# Patient Record
Sex: Male | Born: 1975 | Race: White | Hispanic: No | Marital: Married | Smoking: Never smoker
Health system: Southern US, Community
[De-identification: ages and names within clinical notes are randomized; demographics above are authoritative.]

## PROBLEM LIST (undated history)

## (undated) DIAGNOSIS — K56609 Unspecified intestinal obstruction, unspecified as to partial versus complete obstruction: Secondary | ICD-10-CM

## (undated) DIAGNOSIS — K5090 Crohn's disease, unspecified, without complications: Secondary | ICD-10-CM

## (undated) HISTORY — PX: KNEE SURGERY: SHX244

## (undated) HISTORY — PX: ABDOMINAL SURGERY: SHX537

---

## 2011-10-21 ENCOUNTER — Emergency Department (HOSPITAL_BASED_OUTPATIENT_CLINIC_OR_DEPARTMENT_OTHER)
Admission: EM | Admit: 2011-10-21 | Discharge: 2011-10-22 | Disposition: A | Discharge status: Home / Self Care | Payer: Managed Care, Other (non HMO) | Attending: Emergency Medicine | Admitting: Emergency Medicine

## 2011-10-21 ENCOUNTER — Encounter (HOSPITAL_BASED_OUTPATIENT_CLINIC_OR_DEPARTMENT_OTHER): Payer: Self-pay | Admitting: *Deleted

## 2011-10-21 DIAGNOSIS — R21 Rash and other nonspecific skin eruption: Secondary | ICD-10-CM | POA: Insufficient documentation

## 2011-10-21 DIAGNOSIS — L509 Urticaria, unspecified: Secondary | ICD-10-CM | POA: Insufficient documentation

## 2011-10-21 DIAGNOSIS — K5090 Crohn's disease, unspecified, without complications: Secondary | ICD-10-CM | POA: Insufficient documentation

## 2011-10-21 HISTORY — DX: Crohn's disease, unspecified, without complications: K50.90

## 2011-10-21 MED ORDER — FAMOTIDINE 20 MG PO TABS: 20.0000 mg | ORAL_TABLET | Freq: Two times a day (BID) | ORAL | Status: DC

## 2011-10-21 MED ORDER — FAMOTIDINE 20 MG PO TABS
20.0000 mg | ORAL_TABLET | Freq: Once | ORAL | Status: AC
  Administered 2011-10-22: 20 mg via ORAL
  Filled 2011-10-21: qty 1

## 2011-10-21 NOTE — ED Provider Notes (Signed)
History     CSN: [id number]  Arrival date & time 10/21/11  2037   First MD Initiated Contact with Patient 10/21/11 2311      Chief Complaint  Patient presents with  . Rash    (Consider location/radiation/quality/duration/timing/severity/associated sxs/prior treatment) Patient is a 36 y.o. male presenting with rash. The history is provided by the patient.  Rash  The current episode started 12 to 24 hours ago. The problem has been rapidly improving. The problem is associated with new medications. There has been no fever. The rash is present on the trunk, right arm and left arm. The pain is at a severity of 0/10. The patient is experiencing no pain. Associated symptoms include itching. Pertinent negatives include no blisters, no pain and no weeping. He has tried antihistamines for the symptoms. The treatment provided significant relief.   Patient with history of Crohn's disease being treated [facility] with Remicade injections has been having trouble with urticaria on and off since then in conversation with his GI doctor at [facility] they're going to stop this but it is an injection and stays around for several weeks patient with Benadryl this evening at 7:30 with now significant improvement in the hives never had any breathing problems lip swelling or tongue swelling.   Past Medical History  Diagnosis Date  . Crohn disease     Past Surgical History  Procedure Date  . Abdominal surgery     History reviewed. No pertinent family history.  History  Substance Use Topics  . Smoking status: Never Smoker   . Smokeless tobacco: Not on file  . Alcohol Use: No      Review of Systems  Constitutional: Negative for fever and chills.  HENT: Negative for facial swelling, trouble swallowing and neck pain.   Eyes: Negative for itching.  Respiratory: Negative for shortness of breath.   Cardiovascular: Negative for chest pain.  Gastrointestinal: Negative for abdominal pain.  Genitourinary: Negative  for dysuria.  Musculoskeletal: Negative for back pain.  Skin: Positive for itching and rash.  Neurological: Negative for syncope, speech difficulty and headaches.  Hematological: Does not bruise/bleed easily.    Allergies  Demerol; Morphine and related; and Remicade  Home Medications   Current Outpatient Rx  Name Route Sig Dispense Refill  . DIPHENHYDRAMINE HCL 25 MG PO TABS Oral Take 50 mg by mouth every 6 (six) hours as needed. For allergic reaction    . DIPHENOXYLATE-ATROPINE 2.5-0.025 MG PO TABS Oral Take 2 tablets by mouth 3 (three) times daily.    Marland Kitchen FAMOTIDINE 20 MG PO TABS Oral Take 1 tablet (20 mg total) by mouth 2 (two) times daily. 30 tablet 0    BP 138/77  Pulse 74  Temp(Src) 97.5 F (36.4 C) (Oral)  Resp 16  Ht 6' (1.829 m)  Wt 186 lb (84.369 kg)  BMI 25.23 kg/m2  SpO2 100%  Physical Exam  Nursing note and vitals reviewed. Constitutional: He is oriented to person, place, and time. He appears well-developed and well-nourished. No distress.  HENT:  Head: Normocephalic and atraumatic.  Mouth/Throat: Oropharynx is clear and moist.       No lip or tongue swelling  Eyes: Conjunctivae and EOM are normal. Pupils are equal, round, and reactive to light.  Neck: Normal range of motion. Neck supple.  Cardiovascular: Normal rate, regular rhythm and normal heart sounds.   No murmur heard. Pulmonary/Chest: Effort normal and breath sounds normal. No respiratory distress. He has no wheezes.  Abdominal: Soft. Bowel sounds are  normal. There is no tenderness.  Musculoskeletal: Normal range of motion. He exhibits no edema.  Neurological: He is alert and oriented to person, place, and time. No cranial nerve deficit. He exhibits normal muscle tone. Coordination normal.  Skin: Skin is warm. Rash noted. There is erythema.       Red blotches remnant of resolving urticaria no distinct hives currently her blotches are predominantly on the trunk and upper extremity.    ED Course    Procedures (including critical care time)  Labs Reviewed - No data to display No results found.   1. Hives       MDM   Patient with a history of Crohn's disease followed by physicians at [facility] has been getting injections of Remicade, has been having trouble with urticaria hives on and off since then. His GI doctor at [facility] is going to stop these these are injections of the around for several weeks yet. In the ED without any specific treatment on our part the hives have resolved significantly patient did have Benadryl at 7:30 this evening. No trouble breathing no lip or tongue swelling no wheezing. Patient due to his Crohn's refer not to have a course of steroids will supplement with Pepcid recommended taking Benadryl for the next 24 hours and Pepcid for the next 7 days.        Shelda Jakes, MD 10/22/11 0005

## 2011-10-21 NOTE — ED Notes (Signed)
Pt c/o rash to chest abd, and arms x 2 days from remicade

## 2016-05-31 ENCOUNTER — Encounter (INDEPENDENT_AMBULATORY_CARE_PROVIDER_SITE_OTHER): Payer: Self-pay

## 2016-05-31 ENCOUNTER — Ambulatory Visit (HOSPITAL_BASED_OUTPATIENT_CLINIC_OR_DEPARTMENT_OTHER)
Admission: RE | Admit: 2016-05-31 | Discharge: 2016-05-31 | Disposition: A | Discharge status: Home / Self Care | Payer: Managed Care, Other (non HMO) | Source: Ambulatory Visit | Attending: Family Medicine | Admitting: Family Medicine

## 2016-05-31 ENCOUNTER — Ambulatory Visit (INDEPENDENT_AMBULATORY_CARE_PROVIDER_SITE_OTHER): Payer: Managed Care, Other (non HMO) | Admitting: Family Medicine

## 2016-05-31 ENCOUNTER — Encounter: Payer: Self-pay | Admitting: Family Medicine

## 2016-05-31 VITALS — BP 121/78 | HR 65 | Ht 72.0 in | Wt 181.0 lb

## 2016-05-31 DIAGNOSIS — S59901A Unspecified injury of right elbow, initial encounter: Secondary | ICD-10-CM

## 2016-05-31 DIAGNOSIS — X58XXXA Exposure to other specified factors, initial encounter: Secondary | ICD-10-CM | POA: Diagnosis not present

## 2016-05-31 NOTE — Patient Instructions (Signed)
I'm concerned you tore the UCL in your elbow and have a partial common flexor tendon tear. Get x-rays downstairs as you leave today.   Assuming these are normal I would go ahead with an MRI. In meantime ice the area 15 minutes at a time 3-4 times a day. Ibuprofen 600mg  three times a day with food OR aleve 2 tabs twice a day with food for pain and inflammation. Compression (ACE wrap or sleeve) to help with the swelling. Follow up vs referral will depend on the imaging results.

## 2016-06-01 DIAGNOSIS — S59901A Unspecified injury of right elbow, initial encounter: Secondary | ICD-10-CM | POA: Insufficient documentation

## 2016-06-01 NOTE — Assessment & Plan Note (Signed)
concerning for UCL tear, partial flexor tendon tear (lateral noted on ultrasound).  Independently reviewed radiographs and no evidence fracture.  Will go ahead with MRI to assess.  Icing, nsaids, compression in meantime.

## 2016-06-01 NOTE — Progress Notes (Addendum)
PCP: Olivia MackieWilliam Cho, MD  Subjective:   HPI: Patient is a 40 y.o. male here for right elbow injury.  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 Was about 1 2/3rds inning in when during a fastball he felt and heart a crack and pop medial right elbow. He throws right handed. Pain has improved since then but still with swelling, 2/10 level of pain and soreness. Cannot straighten out elbow. No prior injuries. Pain is medial. No numbness or tingling. No skin changes.  Past Medical History:  Diagnosis Date  . Crohn disease [facility](HCC)     Current Outpatient Prescriptions on File Prior to Visit  Medication Sig Dispense Refill  . diphenhydrAMINE (BENADRYL) 25 MG tablet Take 50 mg by mouth every 6 (six) hours as needed. For allergic reaction    . diphenoxylate-atropine (LOMOTIL) 2.5-0.025 MG per tablet Take 2 tablets by mouth 3 (three) times daily.    . famotidine (PEPCID) 20 MG tablet Take 1 tablet (20 mg total) by mouth 2 (two) times daily. 30 tablet 0   No current facility-administered medications on file prior to visit.     Past Surgical History:  Procedure Laterality Date  . ABDOMINAL SURGERY      Allergies  Allergen Reactions  . Demerol [Meperidine Hcl] Nausea And Vomiting and Rash  . Morphine And Related Nausea And Vomiting and Rash  . Remicade [Infliximab] Rash    Social History   Social History  . Marital status: Married    Spouse name: N/A  . Number of children: N/A  . Years of education: N/A   Occupational History  . Not on file.   Social History Main Topics  . Smoking status: Never Smoker  . Smokeless tobacco: Never Used  . Alcohol use No  . Drug use: No  . Sexual activity: No   Other Topics Concern  . Not on file   Social History Narrative  . No narrative on file    No family history on file.  BP 121/78   Pulse 65   Ht 6' (1.829 m)   Wt 181 lb (82.1 kg)   BMI 24.55 kg/m   Review of Systems: See HPI above.     Objective:  Physical Exam:  Gen: NAD, comfortable in exam room  Right elbow: Mild swelling medially and over antecubital area.  No bruising.  No other deformity. TTP medial epicondyle and just distal to this, over UCL. Full flexion but lacks 10 degrees extension. Pain with resisted wrist, digit flexion, pronation. Laxity on valgus stress compared to left elbow.  No laxity varus. NVI distally. Negative tinels cubital tunnel.  Left elbow: FROM without pain.  MSK u/s:  UCL difficult to visualize.  Common flexor tendon appears partially torn at insertion on medial epicondyle with edema.  No bony abnormalities.    Assessment & Plan:  1. Right elbow injury - concerning for UCL tear, partial flexor tendon tear (noted on ultrasound).  Independently reviewed radiographs and no evidence fracture.  Will go ahead with MRI to assess.  Icing, nsaids, compression in meantime.  Addendum:  MRI reviewed and discussed with patient.  He has torn the UCL as expected.  The common flexor tendon appears better than it does on ultrasound.  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  He will start with conservative treatment - 2 weeks of rest, icing,  nsaids then will f/u in office.  Plan to start physical therapy at that time.

## 2016-06-04 ENCOUNTER — Ambulatory Visit (HOSPITAL_BASED_OUTPATIENT_CLINIC_OR_DEPARTMENT_OTHER)
Admission: RE | Admit: 2016-06-04 | Discharge: 2016-06-04 | Disposition: A | Discharge status: Home / Self Care | Payer: Managed Care, Other (non HMO) | Source: Ambulatory Visit | Attending: Family Medicine | Admitting: Family Medicine

## 2016-06-04 DIAGNOSIS — M6289 Other specified disorders of muscle: Secondary | ICD-10-CM | POA: Diagnosis not present

## 2016-06-04 DIAGNOSIS — M25422 Effusion, left elbow: Secondary | ICD-10-CM | POA: Diagnosis not present

## 2016-06-04 DIAGNOSIS — S59901A Unspecified injury of right elbow, initial encounter: Secondary | ICD-10-CM

## 2016-06-04 DIAGNOSIS — S53441A Ulnar collateral ligament sprain of right elbow, initial encounter: Secondary | ICD-10-CM | POA: Insufficient documentation

## 2016-06-07 ENCOUNTER — Encounter: Payer: Self-pay | Admitting: Family Medicine

## 2016-06-20 ENCOUNTER — Ambulatory Visit (INDEPENDENT_AMBULATORY_CARE_PROVIDER_SITE_OTHER): Payer: Managed Care, Other (non HMO) | Admitting: Family Medicine

## 2016-06-20 ENCOUNTER — Encounter: Payer: Self-pay | Admitting: Family Medicine

## 2016-06-20 VITALS — BP 93/61 | HR 94 | Ht 72.0 in | Wt 181.0 lb

## 2016-06-20 DIAGNOSIS — S59901A Unspecified injury of right elbow, initial encounter: Secondary | ICD-10-CM | POA: Diagnosis not present

## 2016-06-20 NOTE — Patient Instructions (Signed)
Start physical therapy - do home exercises on days you don't go to therapy. Icing, ibuprofen/aleve only if needed now. Follow up with me about a month after you've started therapy.

## 2016-06-21 NOTE — Progress Notes (Signed)
PCP: Olivia MackieWilliam Cho, MD  Subjective:   HPI: Patient is a 40 y.o. male here for right elbow injury.  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 Was about 1 2/3rds inning in when during a fastball he felt and heart a crack and pop medial right elbow. He throws right handed. Pain has improved since then but still with swelling, 2/10 level of pain and soreness. Cannot straighten out elbow. No prior injuries. Pain is medial. No numbness or tingling. No skin changes.  9/11: Patient reports he has improved since last visit. Pain level 0/10. Still does not feel like he could throw a baseball - gets sharp twinge at times medially that goes away quickly. Pain up to 6/10 only with this twinge. No swelling. Done with sling and medications. No numbness or tingling. No skin changes.  Past Medical History:  Diagnosis Date  . Crohn disease [facility](HCC)     Current Outpatient Prescriptions on File Prior to Visit  Medication Sig Dispense Refill  . diphenhydrAMINE (BENADRYL) 25 MG tablet Take 50 mg by mouth every 6 (six) hours as needed. For allergic reaction    . diphenoxylate-atropine (LOMOTIL) 2.5-0.025 MG per tablet Take 2 tablets by mouth 3 (three) times daily.    . famotidine (PEPCID) 20 MG tablet Take 1 tablet (20 mg total) by mouth 2 (two) times daily. 30 tablet 0   No current facility-administered medications on file prior to visit.     Past Surgical History:  Procedure Laterality Date  . ABDOMINAL SURGERY      Allergies  Allergen Reactions  . Demerol [Meperidine Hcl] Nausea And Vomiting and Rash  . Morphine And Related Nausea And Vomiting and Rash  . Remicade [Infliximab] Rash    Social History   Social History  . Marital status: Married    Spouse name: N/A  . Number of children: N/A  . Years of education: N/A   Occupational History  . Not on file.   Social History Main Topics  . Smoking status: Never Smoker  . Smokeless tobacco:  Never Used  . Alcohol use No  . Drug use: No  . Sexual activity: No   Other Topics Concern  . Not on file   Social History Narrative  . No narrative on file    No family history on file.  BP 93/61   Pulse 94   Ht 6' (1.829 m)   Wt 181 lb (82.1 kg)   BMI 24.55 kg/m   Review of Systems: See HPI above.    Objective:  Physical Exam:  Gen: NAD, comfortable in exam room  Right elbow: No obvious swelling, bruising, deformity. TTP medial epicondyle and just distal to this, over UCL. Full flexion.  Lacks a few degrees extension only, improved. No pain with resisted wrist, digit flexion, pronation. Laxity on valgus stress.  No laxity varus. NVI distally. Negative tinels cubital tunnel.  Left elbow: FROM without pain.    Assessment & Plan:  1. Right elbow injury - UCL tear and strain of common flexor tendon.  Doing well clinically.  Will start physical therapy now.  Icing, ibuprofen or aleve only if needed.  F/u in 1 month.

## 2016-06-21 NOTE — Assessment & Plan Note (Signed)
UCL tear and strain of common flexor tendon.  Doing well clinically.  Will start physical therapy now.  Icing, ibuprofen or aleve only if needed.  F/u in 1 month.

## 2016-06-27 ENCOUNTER — Ambulatory Visit: Payer: Managed Care, Other (non HMO) | Admitting: Physical Therapy

## 2016-06-27 NOTE — Addendum Note (Signed)
Addended by: Kathi SimpersWISE, Mikah Poss F on: 06/27/2016 09:29 AM   Modules accepted: Orders

## 2016-06-29 ENCOUNTER — Encounter: Payer: Self-pay | Admitting: Family Medicine

## 2016-06-29 ENCOUNTER — Ambulatory Visit: Payer: Managed Care, Other (non HMO) | Admitting: Occupational Therapy

## 2016-06-29 NOTE — Telephone Encounter (Signed)
Appointment made at [facility] for tomorrow - patient notified.

## 2017-03-06 ENCOUNTER — Encounter (HOSPITAL_BASED_OUTPATIENT_CLINIC_OR_DEPARTMENT_OTHER): Payer: Self-pay

## 2017-03-06 ENCOUNTER — Emergency Department (HOSPITAL_BASED_OUTPATIENT_CLINIC_OR_DEPARTMENT_OTHER): Payer: BLUE CROSS/BLUE SHIELD

## 2017-03-06 ENCOUNTER — Emergency Department (HOSPITAL_BASED_OUTPATIENT_CLINIC_OR_DEPARTMENT_OTHER)
Admission: EM | Admit: 2017-03-06 | Discharge: 2017-03-06 | Disposition: A | Discharge status: Other Acute Inpatient Hospital | Payer: BLUE CROSS/BLUE SHIELD | Attending: Emergency Medicine | Admitting: Emergency Medicine

## 2017-03-06 DIAGNOSIS — Z4659 Encounter for fitting and adjustment of other gastrointestinal appliance and device: Secondary | ICD-10-CM

## 2017-03-06 DIAGNOSIS — Z79899 Other long term (current) drug therapy: Secondary | ICD-10-CM | POA: Diagnosis not present

## 2017-03-06 DIAGNOSIS — K56699 Other intestinal obstruction unspecified as to partial versus complete obstruction: Secondary | ICD-10-CM | POA: Insufficient documentation

## 2017-03-06 DIAGNOSIS — R1084 Generalized abdominal pain: Secondary | ICD-10-CM | POA: Diagnosis present

## 2017-03-06 DIAGNOSIS — K56609 Unspecified intestinal obstruction, unspecified as to partial versus complete obstruction: Secondary | ICD-10-CM

## 2017-03-06 HISTORY — DX: Unspecified intestinal obstruction, unspecified as to partial versus complete obstruction: K56.609

## 2017-03-06 LAB — COMPREHENSIVE METABOLIC PANEL
ALK PHOS: 87 U/L (ref 38–126)
ALT: 19 U/L (ref 17–63)
ANION GAP: 10 (ref 5–15)
AST: 38 U/L (ref 15–41)
Albumin: 4.4 g/dL (ref 3.5–5.0)
BUN: 21 mg/dL — ABNORMAL HIGH (ref 6–20)
CALCIUM: 9.2 mg/dL (ref 8.9–10.3)
CO2: 25 mmol/L (ref 22–32)
CREATININE: 1.21 mg/dL (ref 0.61–1.24)
Chloride: 104 mmol/L (ref 101–111)
GFR calc non Af Amer: >60 mL/min (ref >60)
GLUCOSE: 126 mg/dL — AB (ref 65–99)
Potassium: 3.7 mmol/L (ref 3.5–5.1)
SODIUM: 139 mmol/L (ref 135–145)
Total Bilirubin: 2.5 mg/dL — ABNORMAL HIGH (ref 0.3–1.2)
Total Protein: 8.5 g/dL — ABNORMAL HIGH (ref 6.5–8.1)

## 2017-03-06 LAB — CBC WITH DIFFERENTIAL/PLATELET
Basophils Absolute: 0 10*3/uL (ref 0.0–0.1)
Basophils Relative: 0 %
EOS ABS: 0.1 10*3/uL (ref 0.0–0.7)
Eosinophils Relative: 1 %
HEMATOCRIT: 44.7 % (ref 39.0–52.0)
HEMOGLOBIN: 15.4 g/dL (ref 13.0–17.0)
LYMPHS ABS: 0.3 10*3/uL — AB (ref 0.7–4.0)
LYMPHS PCT: 3 %
MCH: 28.9 pg (ref 26.0–34.0)
MCHC: 34.5 g/dL (ref 30.0–36.0)
MCV: 83.9 fL (ref 78.0–100.0)
MONOS PCT: 8 %
Monocytes Absolute: 0.8 10*3/uL (ref 0.1–1.0)
NEUTROS ABS: 8.8 10*3/uL — AB (ref 1.7–7.7)
NEUTROS PCT: 88 %
Platelets: 223 10*3/uL (ref 150–400)
RBC: 5.33 MIL/uL (ref 4.22–5.81)
RDW: 15 % (ref 11.5–15.5)
WBC: 10 10*3/uL (ref 4.0–10.5)

## 2017-03-06 LAB — LIPASE, BLOOD: Lipase: 33 U/L (ref 11–51)

## 2017-03-06 LAB — I-STAT CG4 LACTIC ACID, ED: Lactic Acid, Venous: 1.59 mmol/L (ref 0.5–1.9)

## 2017-03-06 LAB — URINALYSIS, ROUTINE W REFLEX MICROSCOPIC
Glucose, UA: NEGATIVE mg/dL
HGB URINE DIPSTICK: NEGATIVE
KETONES UR: 15 mg/dL — AB
Leukocytes, UA: NEGATIVE
Nitrite: NEGATIVE
PH: 5.5 (ref 5.0–8.0)
Protein, ur: 30 mg/dL — AB
SPECIFIC GRAVITY, URINE: 1.039 — AB (ref 1.005–1.030)

## 2017-03-06 LAB — URINALYSIS, MICROSCOPIC (REFLEX)

## 2017-03-06 MED ORDER — SODIUM CHLORIDE 0.9 % IV BOLUS (SEPSIS)
1000.0000 mL | Freq: Once | INTRAVENOUS | Status: AC
  Administered 2017-03-06: 1000 mL via INTRAVENOUS

## 2017-03-06 MED ORDER — HYDROMORPHONE HCL 1 MG/ML IJ SOLN
1.0000 mg | Freq: Once | INTRAMUSCULAR | Status: AC
  Administered 2017-03-06: 1 mg via INTRAVENOUS
  Filled 2017-03-06: qty 1

## 2017-03-06 MED ORDER — LORAZEPAM 2 MG/ML IJ SOLN
1.0000 mg | Freq: Once | INTRAMUSCULAR | Status: AC
  Administered 2017-03-06: 1 mg via INTRAVENOUS
  Filled 2017-03-06: qty 1

## 2017-03-06 MED ORDER — SODIUM CHLORIDE 0.9 % IV SOLN
Freq: Once | INTRAVENOUS | Status: AC
  Administered 2017-03-06: 20:00:00 via INTRAVENOUS

## 2017-03-06 MED ORDER — ONDANSETRON HCL 4 MG/2ML IJ SOLN
4.0000 mg | Freq: Once | INTRAMUSCULAR | Status: AC
  Administered 2017-03-06: 4 mg via INTRAVENOUS
  Filled 2017-03-06: qty 2

## 2017-03-06 MED ORDER — METOCLOPRAMIDE HCL 5 MG/ML IJ SOLN
10.0000 mg | Freq: Once | INTRAMUSCULAR | Status: AC
  Administered 2017-03-06: 10 mg via INTRAVENOUS
  Filled 2017-03-06: qty 2

## 2017-03-06 MED ORDER — IOPAMIDOL (ISOVUE-300) INJECTION 61%
100.0000 mL | Freq: Once | INTRAVENOUS | Status: AC | PRN
  Administered 2017-03-06: 100 mL via INTRAVENOUS

## 2017-03-06 MED ORDER — LIDOCAINE HCL (CARDIAC) 20 MG/ML IV SOLN
INTRAVENOUS | Status: AC
  Filled 2017-03-06: qty 5

## 2017-03-06 MED ORDER — LIDOCAINE HCL 2 % EX GEL
1.0000 "application " | Freq: Once | CUTANEOUS | Status: AC
  Administered 2017-03-06: 1
  Filled 2017-03-06: qty 20

## 2017-03-06 NOTE — ED Notes (Addendum)
Large intestine was removed 23 years ago due to ulcerative colitis.  He has been vomiting since 9 am this morning and has been vomiting 8 times with the last vomitus looking like a fecal matter, large in amount per wife.

## 2017-03-06 NOTE — ED Notes (Signed)
Pt will try to give urine sample in a few minutes and wants to go to the restroom, no urinal.

## 2017-03-06 NOTE — ED Notes (Signed)
ED Provider at bedside. 

## 2017-03-06 NOTE — ED Provider Notes (Signed)
[facility] Provider Note   CSN: [id number] Arrival date & time: 03/06/17  1633  By signing my name below, I, Jesse Crane, attest that this documentation has been prepared under the direction and in the presence of Jesse Pili, PA-C. Electronically Signed: Karren Crane, ED Scribe. 03/06/17. 5:09 PM.  History   Chief Complaint Chief Complaint  Patient presents with  . Abdominal Pain   The history is provided by the patient and the spouse. No language interpreter was used.    HPI Comments: Jesse Crane is a 41 y.o. male with a PMHx or Crohn's disease and bowel obstruction, who presents to the Emergency Department complaining of sudden onset, gradually worsening lower quadrant abdominal pain that started yesterday. He reports associated nausea, vomiting, unable to pas flatulence, and constipation. He notes eight episodes of vomiting. Pt notes yesterday he was unable to have a bowel movement which is this occasionally happened but he notes that it normally last for two to four hours. He notes he currently has a J-pouch. He is followed by Dr. Riccardo Crane at [facility]. No alleviating or aggravating factors noted at this time. Denies chest pain, shortness of breath, or fever. No other acute associated symptoms noted at this time.   Past Medical History:  Diagnosis Date  . Bowel obstruction (HCC)   . Crohn disease [facility]     Patient Active Problem List   Diagnosis Date Noted  . Injury of right elbow 06/01/2016    Past Surgical History:  Procedure Laterality Date  . ABDOMINAL SURGERY    . KNEE SURGERY         Home Medications    Prior to Admission medications   Medication Sig Start Date End Date Taking? Authorizing Provider  BuPROPion HCl (WELLBUTRIN PO) Take by mouth.   Yes [provider]  Ustekinumab (STELARA Benjamin Perez) Inject into the skin.   Yes [provider]  diphenoxylate-atropine (LOMOTIL) 2.5-0.025 MG per tablet Take 2 tablets by mouth 3  (three) times daily.    [provider]    Family History No family history on file.  Social History Social History  Substance Use Topics  . Smoking status: Never Smoker  . Smokeless tobacco: Never Used  . Alcohol use No     Allergies   Demerol [meperidine hcl]; Morphine and related; and Remicade [infliximab]   Review of Systems Review of Systems  Constitutional: Negative for chills and fever.  Respiratory: Negative for shortness of breath.   Cardiovascular: Negative for chest pain.  Gastrointestinal: Positive for abdominal pain, constipation, nausea and vomiting.  A complete 10 system review of systems was obtained and all systems are negative except as noted in the HPI and PMH.    Physical Exam Updated Vital Signs BP (!) 134/97 (BP Location: Right Arm)   Pulse (!) 113   Temp 98.2 F (36.8 C) (Oral)   Resp 16   Ht 6' (1.829 m)   Wt 174 lb 8 oz (79.2 kg)   SpO2 97%   BMI 23.67 kg/m   Physical Exam  Constitutional: He is oriented to person, place, and time. Vital signs are normal. He appears well-developed and well-nourished.  HENT:  Head: Normocephalic and atraumatic.  Right Ear: Hearing normal.  Left Ear: Hearing normal.  Eyes: Conjunctivae and EOM are normal. Pupils are equal, round, and reactive to light.  Neck: Normal range of motion. Neck supple.  Cardiovascular: Normal rate, regular rhythm, normal heart sounds and intact distal pulses.  Pulmonary/Chest: Effort normal and breath sounds normal. No respiratory distress.  Abdominal: Soft. Bowel sounds are normal. He exhibits no distension and no ascites. There is generalized tenderness. There is no rigidity, no rebound, no guarding, no CVA tenderness, no tenderness at McBurney's point and negative Murphy's sign.  Diffusely tender.   Musculoskeletal: Normal range of motion.  Neurological: He is alert and oriented to person, place, and time.  Skin: Skin is warm and dry.  Psychiatric: He has a normal  mood and affect. His speech is normal and behavior is normal. Judgment and thought content normal.  Nursing note and vitals reviewed.  ED Treatments / Results  DIAGNOSTIC STUDIES: Oxygen Saturation is 97% on RA, adequate by my interpretation.   COORDINATION OF CARE: 4:49 PM-Discussed next steps with pt. Pt verbalized understanding and is agreeable with the plan.   Labs (all labs ordered are listed, but only abnormal results are displayed) Labs Reviewed  CBC WITH DIFFERENTIAL/PLATELET - Abnormal; Notable for the following:       Result Value   Neutro Abs 8.8 (*)    Lymphs Abs 0.3 (*)    All other components within normal limits  COMPREHENSIVE METABOLIC PANEL - Abnormal; Notable for the following:    Glucose, Bld 126 (*)    BUN 21 (*)    Total Protein 8.5 (*)    Total Bilirubin 2.5 (*)    All other components within normal limits  URINALYSIS, ROUTINE W REFLEX MICROSCOPIC - Abnormal; Notable for the following:    Color, Urine AMBER (*)    Specific Gravity, Urine 1.039 (*)    Bilirubin Urine SMALL (*)    Ketones, ur 15 (*)    Protein, ur 30 (*)    All other components within normal limits  URINALYSIS, MICROSCOPIC (REFLEX) - Abnormal; Notable for the following:    Bacteria, UA RARE (*)    Squamous Epithelial / LPF 0-5 (*)    All other components within normal limits  LIPASE, BLOOD  I-STAT CG4 LACTIC ACID, ED    EKG  EKG Interpretation None      Radiology Ct Abdomen Pelvis W Contrast  Result Date: 03/06/2017 CLINICAL DATA:  41 year old male with history of Crohn's disease and J pouch surgery presenting with abdominal pain. Caps EXAM: CT ABDOMEN AND PELVIS WITH CONTRAST TECHNIQUE: Multidetector CT imaging of the abdomen and pelvis was performed using the standard protocol following bolus administration of intravenous contrast. CONTRAST:  100mL ISOVUE-300 IOPAMIDOL (ISOVUE-300) INJECTION 61% COMPARISON:  None. FINDINGS: Lower chest: The visualized lung bases are clear. No  intra-abdominal free air or free fluid. Hepatobiliary: No focal liver abnormality is seen. No gallstones, gallbladder wall thickening, or biliary dilatation. Pancreas: Unremarkable. No pancreatic ductal dilatation or surrounding inflammatory changes. Spleen: Normal in size without focal abnormality. Adrenals/Urinary Tract: Adrenal glands are unremarkable. Kidneys are normal, without renal calculi, focal lesion, or hydronephrosis. Bladder is unremarkable. Stomach/Bowel: There is postsurgical changes of colectomy and J-pouch surgery. There is segmental thickening of the distal bowel in the lower abdomen and pelvis involving the ileostomy and rectal J pouch which may represent Crohn's flare up. There is narrowing of the anastomosis at the level of the ileostomy in the right lower quadrant resulting in moderate to high-grade obstruction. There is dilatation of fluid-filled loops of small bowel proximal to the ileostomy anastomosis. Small amount of fluid content noted within the J-pouch. The appendix is surgically absent. Vascular/Lymphatic: No significant vascular findings are present. No enlarged abdominal or pelvic lymph nodes. Reproductive: The  prostate and seminal vesicles are grossly unremarkable. Other: None Musculoskeletal: No acute or significant osseous findings. IMPRESSION: Postsurgical changes of colectomy and J-pouch surgery. There is thickening of the distal small bowel loops involving the ileostomy anastomosis and J-pouch which may represent Crohn's flare up. There is associated obstruction at the level of the ileostomy anastomosis in the right lower quadrant. Electronically Signed   By: Elgie Collard M.D.   On: 03/06/2017 19:05    Procedures Procedures (including critical care time)  Medications Ordered in ED Medications - No data to display   Initial Impression / Assessment and Plan / ED Course  I have reviewed the triage vital signs and the nursing notes.  Pertinent labs & imaging  results that were available during my care of the patient were reviewed by me and considered in my medical decision making (see chart for details).  Final Clinical Impressions(s) / ED Diagnoses  I have reviewed and evaluated the relevant laboratory values I have reviewed and evaluated the relevant imaging studies. I have reviewed the relevant previous healthcare records. I obtained HPI from historian. Patient discussed with supervising physician  ED Course:  Assessment: Pt is a 41 y.o. male with hx Crohn's disease and bowel obstruction who presents with abdominal pain with onset yesterday. Hx same with SBO. Notes N/V. Unable to tolerate PO. Not passing flatus or BMs. On exam, pt in NAD. Nontoxic/nonseptic appearing. VS with tachycardia. Afebrile. Lungs CTA. Heart RRR. Abdomen diffusely tender but soft. CBC unremarkable. CMP unremarkable. Lipase negative. UA unremarkable. Concern for SBO vs abscess as hx Crohns. CT Abdomen/pelvis shows colectomy and J-pouch from previous. Thickening of distal small bowel involving ileostomy anastomosis. Noted obstruction at level of ileostomy anastomosis in RLQ. Given analgesia and antiemetics in ED. Consult to [facility] (Dr. Mindi Junker) Will transfer to [facility]. NG tube placed prior to transfer with success. At time of transfer, VSS. NAD. Pain controlled. Emesis controlled.   Dr. Riccardo Crane (Gastroenterology)   Disposition/Plan:  Transfer to [facility] Pt acknowledges and agrees with plan  Supervising Physician Rolan Bucco, MD   Final diagnoses:  SBO (small bowel obstruction) [facility])    New Prescriptions New Prescriptions   No medications on file   I personally performed the services described in this documentation, which was scribed in my presence. The recorded information has been reviewed and is accurate.    Jesse Pili, PA-C 03/06/17 [id number]    Rolan Bucco, MD 03/06/17 [phone]

## 2017-03-06 NOTE — ED Notes (Signed)
Dewayne ShorterLaurie Frese, wife, [phone].

## 2017-03-06 NOTE — ED Triage Notes (Signed)
C/o abd pain, n/v since 7pm yesterday-feels like bowel obs-steady gait-grimacing face

## 2017-04-10 ENCOUNTER — Emergency Department (HOSPITAL_COMMUNITY): Payer: BLUE CROSS/BLUE SHIELD

## 2017-04-10 ENCOUNTER — Emergency Department (HOSPITAL_COMMUNITY)
Admission: EM | Admit: 2017-04-10 | Discharge: 2017-04-11 | Disposition: A | Discharge status: Home / Self Care | Payer: BLUE CROSS/BLUE SHIELD | Attending: Emergency Medicine | Admitting: Emergency Medicine

## 2017-04-10 ENCOUNTER — Encounter (HOSPITAL_COMMUNITY): Payer: Self-pay | Admitting: Emergency Medicine

## 2017-04-10 DIAGNOSIS — X509XXA Other and unspecified overexertion or strenuous movements or postures, initial encounter: Secondary | ICD-10-CM | POA: Diagnosis not present

## 2017-04-10 DIAGNOSIS — S52532A Colles' fracture of left radius, initial encounter for closed fracture: Secondary | ICD-10-CM

## 2017-04-10 DIAGNOSIS — Y92320 Baseball field as the place of occurrence of the external cause: Secondary | ICD-10-CM | POA: Insufficient documentation

## 2017-04-10 DIAGNOSIS — Y9364 Activity, baseball: Secondary | ICD-10-CM | POA: Insufficient documentation

## 2017-04-10 DIAGNOSIS — S59912A Unspecified injury of left forearm, initial encounter: Secondary | ICD-10-CM | POA: Diagnosis present

## 2017-04-10 DIAGNOSIS — T1490XA Injury, unspecified, initial encounter: Secondary | ICD-10-CM

## 2017-04-10 DIAGNOSIS — Z79899 Other long term (current) drug therapy: Secondary | ICD-10-CM | POA: Diagnosis not present

## 2017-04-10 DIAGNOSIS — Y998 Other external cause status: Secondary | ICD-10-CM | POA: Diagnosis not present

## 2017-04-10 MED ORDER — TRAMADOL HCL 50 MG PO TABS: 50.0000 mg | ORAL_TABLET | Freq: Four times a day (QID) | ORAL | 0 refills | Status: AC | PRN

## 2017-04-10 MED ORDER — PROPOFOL 10 MG/ML IV BOLUS
200.0000 mg | Freq: Once | INTRAVENOUS | Status: AC
  Administered 2017-04-10: 200 mg via INTRAVENOUS
  Filled 2017-04-10: qty 20

## 2017-04-10 MED ORDER — FENTANYL CITRATE (PF) 100 MCG/2ML IJ SOLN: 100.0000 ug | Freq: Once | INTRAMUSCULAR | Status: DC

## 2017-04-10 MED ORDER — OXYCODONE HCL 5 MG PO TABS: 5.0000 mg | ORAL_TABLET | Freq: Four times a day (QID) | ORAL | 0 refills | Status: AC | PRN

## 2017-04-10 MED ORDER — FENTANYL CITRATE (PF) 100 MCG/2ML IJ SOLN
100.0000 ug | Freq: Once | INTRAMUSCULAR | Status: AC
Start: 2017-04-10 — End: 2017-04-10
  Administered 2017-04-10: 100 ug via INTRAVENOUS
  Filled 2017-04-10: qty 2

## 2017-04-10 MED ORDER — FENTANYL CITRATE (PF) 100 MCG/2ML IJ SOLN
100.0000 ug | Freq: Once | INTRAMUSCULAR | Status: AC
  Administered 2017-04-10: 100 ug via INTRAVENOUS
  Filled 2017-04-10: qty 2

## 2017-04-10 MED ORDER — PROPOFOL 10 MG/ML IV BOLUS
INTRAVENOUS | Status: AC | PRN
  Administered 2017-04-10: 50 mg via INTRAVENOUS
  Administered 2017-04-10: 40 mg via INTRAVENOUS
  Administered 2017-04-10: 50 mg via INTRAVENOUS

## 2017-04-10 MED ORDER — ACETAMINOPHEN 325 MG PO TABS: 650.0000 mg | ORAL_TABLET | Freq: Four times a day (QID) | ORAL | Status: AC | PRN

## 2017-04-10 NOTE — ED Provider Notes (Signed)
[facility]-EMERGENCY DEPT Provider Note   CSN: [id number] Arrival date & time: 04/10/17  2125     History   Chief Complaint Chief Complaint  Patient presents with  . Arm Injury    HPI Jesse Crane is a 41 y.o. male. CC:  Wrist fracture  HPI:  41 year old male. He was playing softball. He was running to catch a ball. He fell onto an pronated, dorsiflexed outstretched left hand. He went into hyper dorsiflexion. Felt immediate pain. Nose no deformity of his hand. Presents here.  Past Medical History:  Diagnosis Date  . Bowel obstruction (HCC)   . Crohn disease [facility])     Patient Active Problem List   Diagnosis Date Noted  . Injury of right elbow 06/01/2016    Past Surgical History:  Procedure Laterality Date  . ABDOMINAL SURGERY    . KNEE SURGERY         Home Medications    Prior to Admission medications   Medication Sig Start Date End Date Taking? Authorizing Provider  buPROPion (WELLBUTRIN) 100 MG tablet Take 100 mg by mouth 2 (two) times daily.    Yes [provider]  diphenoxylate-atropine (LOMOTIL) 2.5-0.025 MG per tablet Take 2 tablets by mouth 3 (three) times daily.   Yes [provider]  Ustekinumab (STELARA [place]) Inject into the skin.   Yes [provider]  acetaminophen (TYLENOL) 325 MG tablet Take 2 tablets (650 mg total) by mouth every 6 (six) hours as needed. 04/10/17   Mack Hook, MD  oxyCODONE (ROXICODONE) 5 MG immediate release tablet Take 1-2 tablets (5-10 mg total) by mouth every 6 (six) hours as needed for severe pain. 04/10/17   Mack Hook, MD  traMADol (ULTRAM) 50 MG tablet Take 1 tablet (50 mg total) by mouth every 6 (six) hours as needed. 04/10/17   Rolland Porter, MD  traMADol (ULTRAM) 50 MG tablet Take 1 tablet (50 mg total) by mouth every 6 (six) hours as needed. 04/10/17   Rolland Porter, MD    Family History History reviewed. No pertinent family history.  Social History Social History  Substance Use Topics  . Smoking  status: Never Smoker  . Smokeless tobacco: Never Used  . Alcohol use No     Allergies   Demerol [meperidine hcl]; Morphine and related; and Remicade [infliximab]   Review of Systems Review of Systems  Constitutional: Negative for appetite change, chills, diaphoresis, fatigue and fever.  HENT: Negative for mouth sores, sore throat and trouble swallowing.   Eyes: Negative for visual disturbance.  Respiratory: Negative for cough, chest tightness, shortness of breath and wheezing.   Cardiovascular: Negative for chest pain.  Gastrointestinal: Negative for abdominal distention, abdominal pain, diarrhea, nausea and vomiting.  Endocrine: Negative for polydipsia, polyphagia and polyuria.  Genitourinary: Negative for dysuria, frequency and hematuria.  Musculoskeletal: Positive for arthralgias. Negative for gait problem.  Skin: Negative for color change, pallor and rash.  Neurological: Negative for dizziness, syncope, light-headedness and headaches.  Hematological: Does not bruise/bleed easily.  Psychiatric/Behavioral: Negative for behavioral problems and confusion.     Physical Exam Updated Vital Signs BP (!) 141/89   Pulse 91   Temp 98.2 F (36.8 C) (Oral)   Resp 19   Ht 6' (1.829 m)   Wt 79.4 kg (175 lb)   SpO2 100%   BMI 23.73 kg/m   Physical Exam  Constitutional: He is oriented to person, place, and time. He appears well-developed and well-nourished. No distress.  HENT:  Head: Normocephalic.  Eyes: Conjunctivae are normal. Pupils are equal, round, and reactive to light. No scleral icterus.  Neck: Normal range of motion. Neck supple. No thyromegaly present.  Cardiovascular: Normal rate and regular rhythm.  Exam reveals no gallop and no friction rub.   No murmur heard. Pulmonary/Chest: Effort normal and breath sounds normal. No respiratory distress. He has no wheezes. He has no rales.  Abdominal: Soft. Bowel sounds are normal. He exhibits no distension. There is no  tenderness. There is no rebound.  Musculoskeletal: Normal range of motion.  Left upper extremity shows apex dorsal deformity of the left distal forearm at the radius. Prominence of bone palpable R with stretching of the volar skin but no open fracture. He has "tingling" to all digits volar. He has excellent capillary refill to the tips.  Neurological: He is alert and oriented to person, place, and time.  Skin: Skin is warm and dry. No rash noted.  Psychiatric: He has a normal mood and affect. His behavior is normal.     ED Treatments / Results  Labs (all labs ordered are listed, but only abnormal results are displayed) Labs Reviewed - No data to display  EKG  EKG Interpretation None       Radiology Dg Wrist 2 Views Left  Result Date: 04/10/2017 CLINICAL DATA:  Distal radius fracture, postreduction. EXAM: LEFT WRIST - 2 VIEW COMPARISON:  None. FINDINGS: Two AP and two lateral views of the left wrist. Images 1 and 2 demonstrate improved alignment of distal radius fracture being fixated by a manual reduction. Images 3 and 4 in demonstrate subsequent placement of overlying splint material. Improved alignment of distal radius fracture with only minimal residual displacement. No new fracture is seen. IMPRESSION: Improved alignment of comminuted distal radius fracture postreduction with overlying splint placement. Electronically Signed   By: Rubye OaksMelanie  Ehinger M.D.   On: 04/10/2017 23:07   Dg Wrist 2 Views Left  Result Date: 04/10/2017 CLINICAL DATA:  Left wrist pain and deformity after injury. Fall onto outstretched hand. EXAM: LEFT WRIST - 2 VIEW COMPARISON:  None. FINDINGS: Significantly displaced distal radius fracture, transverse and mildly comminuted. There is dorsal displacement of greater than 1 shaft with and 13 mm osseous overriding and proximal migration of distal fracture for. Fracture likely extends to the radiocarpal joint. The carpals remain aligned with the displaced distal radial  fracture fragment. There is disruption of the distal radioulnar joint. No definite associated ulna styloid fracture. IMPRESSION: Significantly displaced distal radius fracture with dorsal displacement on proximal migration of distal fracture fragment a 13 mm. Fracture likely extends to the radiocarpal joint. There is disruption of normal distal radioulnar joint. Electronically Signed   By: Rubye OaksMelanie  Ehinger M.D.   On: 04/10/2017 22:13    Procedures Procedures (including critical care time)  Medications Ordered in ED Medications  fentaNYL (SUBLIMAZE) injection 100 mcg (100 mcg Intravenous Given 04/10/17 2147)  fentaNYL (SUBLIMAZE) injection 100 mcg (100 mcg Intravenous Given 04/10/17 2224)  propofol (DIPRIVAN) 10 mg/mL bolus/IV push 200 mg (200 mg Intravenous Given 04/10/17 2245)  propofol (DIPRIVAN) 10 mg/mL bolus/IV push (40 mg Intravenous Given 04/10/17 2248)     Initial Impression / Assessment and Plan / ED Course  I have reviewed the triage vital signs and the nursing notes.  Pertinent labs & imaging results that were available during my care of the patient were reviewed by me and considered in my medical decision making (see chart for details).   patient given IV fentanyl. Patient prepped for conscious  sedation. I placed a call immediately to Dr. Mack Hook. Dr. Janee Morn is always return my call immediately. He presents himself immediately to the emergency room. I performed conscious sedation and wrist reduction is performed by Dr. Janee Morn with excellent postreduction anatomical alignment.  Procedural sedation Performed by: Claudean Kinds Consent: Verbal consent obtained. Risks and benefits: risks, benefits and alternatives were discussed Required items: required blood products, implants, devices, and special equipment available Patient identity confirmed: arm band and provided demographic data Time out: Immediately prior to procedure a "time out" was called to verify the correct  patient, procedure, equipment, support staff and site/side marked as required.  Sedation type: moderate (conscious) sedation NPO time confirmed and considedered  Sedatives: PROPOFOL  Physician Time at Bedside: 30  Vitals: Vital signs were monitored during sedation. Cardiac Monitor, pulse oximeter Patient tolerance: Patient tolerated the procedure well with no immediate complications. Comments: Pt with uneventful recovered. Returned to pre-procedural sedation baseline       Final Clinical Impressions(s) / ED Diagnoses   Final diagnoses:  Closed Colles' fracture of left radius, initial encounter    New Prescriptions New Prescriptions   OXYCODONE (ROXICODONE) 5 MG IMMEDIATE RELEASE TABLET    Take 1-2 tablets (5-10 mg total) by mouth every 6 (six) hours as needed for severe pain.   TRAMADOL (ULTRAM) 50 MG TABLET    Take 1 tablet (50 mg total) by mouth every 6 (six) hours as needed.   TRAMADOL (ULTRAM) 50 MG TABLET    Take 1 tablet (50 mg total) by mouth every 6 (six) hours as needed.     Rolland Porter, MD 04/10/17 [phone]

## 2017-04-10 NOTE — ED Triage Notes (Signed)
EMS reports pt was playing baseball and fell landing on left wrist. Deformity noted by EMS. Sam Splint applied and Fentanyl given for pain pt rated pain 7/10 after administration.

## 2017-04-10 NOTE — Consult Note (Signed)
ORTHOPAEDIC CONSULTATION HISTORY & PHYSICAL REQUESTING PHYSICIAN: Rolland Porter, MD  Chief Complaint: Left wrist pain and deformity  HPI: Jesse Crane is a 41 y.o. male who injured his left wrist diving for a ball while playing competitive softball.  He had immediate onset of pain and markedly deformity.  He presents the emergency department for evaluation, with a badly deformed but closed injury.  He has a history of Crohn's disease, some previous nonrelated surgery, and reports no significant pain elsewhere except for the zone of injury.  He reports that he can take oxycodone, but has been advised not to take any nonsteroidals due to his Crohn's disease.  Past Medical History:  Diagnosis Date  . Bowel obstruction (HCC)   . Crohn disease [facility])    Past Surgical History:  Procedure Laterality Date  . ABDOMINAL SURGERY    . KNEE SURGERY     Social History   Social History  . Marital status: Married    Spouse name: N/A  . Number of children: N/A  . Years of education: N/A   Social History Main Topics  . Smoking status: Never Smoker  . Smokeless tobacco: Never Used  . Alcohol use No  . Drug use: No  . Sexual activity: Not Asked   Other Topics Concern  . None   Social History Narrative  . None   History reviewed. No pertinent family history. Allergies  Allergen Reactions  . Demerol [Meperidine Hcl] Nausea And Vomiting and Rash  . Morphine And Related Nausea And Vomiting and Rash  . Remicade [Infliximab] Rash   Prior to Admission medications   Medication Sig Start Date End Date Taking? Authorizing Provider  buPROPion (WELLBUTRIN) 100 MG tablet Take 100 mg by mouth 2 (two) times daily.    Yes [provider]  diphenoxylate-atropine (LOMOTIL) 2.5-0.025 MG per tablet Take 2 tablets by mouth 3 (three) times daily.   Yes [provider]  Ustekinumab (STELARA [place]) Inject into the skin.   Yes [provider]  acetaminophen (TYLENOL) 325 MG tablet  Take 2 tablets (650 mg total) by mouth every 6 (six) hours as needed. 04/10/17   Mack Hook, MD  oxyCODONE (ROXICODONE) 5 MG immediate release tablet Take 1-2 tablets (5-10 mg total) by mouth every 6 (six) hours as needed for severe pain. 04/10/17   Mack Hook, MD  traMADol (ULTRAM) 50 MG tablet Take 1 tablet (50 mg total) by mouth every 6 (six) hours as needed. 04/10/17   Rolland Porter, MD   Dg Wrist 2 Views Left  Result Date: 04/10/2017 CLINICAL DATA:  Left wrist pain and deformity after injury. Fall onto outstretched hand. EXAM: LEFT WRIST - 2 VIEW COMPARISON:  None. FINDINGS: Significantly displaced distal radius fracture, transverse and mildly comminuted. There is dorsal displacement of greater than 1 shaft with and 13 mm osseous overriding and proximal migration of distal fracture for. Fracture likely extends to the radiocarpal joint. The carpals remain aligned with the displaced distal radial fracture fragment. There is disruption of the distal radioulnar joint. No definite associated ulna styloid fracture. IMPRESSION: Significantly displaced distal radius fracture with dorsal displacement on proximal migration of distal fracture fragment a 13 mm. Fracture likely extends to the radiocarpal joint. There is disruption of normal distal radioulnar joint. Electronically Signed   By: Rubye Oaks M.D.   On: 04/10/2017 22:13    Positive ROS: All other systems have been reviewed and were otherwise negative with the exception of those mentioned in the HPI and  as above.  Physical Exam: Vitals: Refer to EMR. Constitutional:  WD, WN, NAD HEENT:  NCAT, EOMI Neuro/Psych:  Alert & oriented to person, place, and time; appropriate mood & affect Lymphatic: No generalized extremity edema or lymphadenopathy Extremities / MSK:  The extremities are normal with respect to appearance, ranges of motion, joint stability, muscle strength/tone, sensation, & perfusion except as otherwise noted:  The left wrist is  grossly deformed, with obvious dorsal translational displacement and shortening.  Markedly diminished light touch sensibility across the palmar surface of the fingertips, including the index and small fingers, but intact pain with pinch in the same regions.  Intact light touch sensibility in the dorsal first web space.  The thumb is noted to be able to flex at the IP joint, week interosseous function noted, and a flicker of detectable digital extension.  Radial pulses palpable, abrasion on the volar surface of the distal forearm.  No tenderness at the elbow or proximally.  Forearm compartments are soft  Assessment: Left distal radius fracture, apparently extra-articular, with complete dorsal translational displacement and shortening  Plan: Following informed consent discussion, conscious sedation provided by Dr. Fayrene FearingJames.  Gentle manipulative reduction performed with grossly improved alignment.  This was confirmed with portable x-ray and a sugar tong splint applied.  Following reduction, the patient's level of pain decreased to less than 10/10, digits were warm and pink, neurovascular exam otherwise not significantly changed.  Swelling precautions reviewed, pain plan to be Tylenol with tramadol and/or oxycodone added as needed, and my office will contact him tomorrow to arrange follow-up.  Additional advanced imaging may be desirable in evaluation of the extent of soft tissue injury.  He will be discharged with instructions for elevation and sling use, as well as range of motion exercises.  Cliffton Astersavid A. Janee Mornhompson, MD      Orthopaedic & Hand Surgery [facility] [address] [place], [place]  [id number] Office: [phone] Mobile: [phone]  04/10/2017, 11:06 PM

## 2017-04-10 NOTE — Discharge Instructions (Addendum)
Radial Fracture  A radial fracture is a break in the radius bone, which is the long bone of the forearm that is on the same side as your thumb. Your forearm is the part of your arm that is between your elbow and your wrist. It is made up of two bones: the radius and the ulna.  Most radial fractures occur near the wrist (distal radialfracture) or near the elbow (radial head fracture). A distal radial fracture is the most common type of broken arm. This fracture usually occurs about an inch above the wrist. Fractures of the middle part of the bone are less common.  What are the causes?  Falling with your arm outstretched is the most common cause of a radial fracture. Other causes include:   Car accidents.   Bike accidents.   A direct blow to the middle part of the radius.    What increases the risk?   You may be at greater risk for a distal radial fracture if you are 60 years of age or older.   You may be at greater risk for a radial head fracture if you are:  ? Male.  ? 30-40 years old.   You may be at a greater risk for all types of radial fractures if you have a condition that causes your bones to be weak or thin (osteoporosis).  What are the signs or symptoms?  A radial fracture causes pain immediately after the injury. Other signs and symptoms include:   An abnormal bend or bump in your arm (deformity).   Swelling.   Bruising.   Numbness or tingling.   Tenderness.   Limited movement.    How is this diagnosed?  Your health care provider may diagnose a radial fracture based on:   Your symptoms.   Your medical history, including any recent injury.   A physical exam. Your health care provider will look for any deformity and feel for tenderness over the break. Your health care provider will also check whether the bone is out of place.   An X-ray exam to confirm the diagnosis and learn more about the type of fracture.    How is this treated?  The goals of treatment are to get the bone in proper  position for healing and to keep it from moving so it will heal over time. Your treatment will depend on many factors, especially the type of fracture that you have.   If the fractured bone:  ? Is in the correct position (nondisplaced), you may only need to wear a cast or a splint.  ? Has a slightly displaced fracture, you may need to have the bones moved back into place manually (closed reduction) before the splint or cast is put on.   You may have a temporary splint before you have a plaster cast. The splint allows room for some swelling. After a few days, a cast can replace the splint.  ? You may have to wear the cast for about 6 weeks or as directed by your health care provider.  ? The cast may be changed after about 3 weeks or as directed by your health care provider.   After your cast is taken off, you may need physical therapy to regain full movement in your wrist or elbow.   You may need emergency surgery if you have:  ? A fractured bone that is out of position (displaced).  ? A fracture with multiple fragments (comminuted fracture).  ? A fracture   that breaks the skin (open fracture). This type of fracture may require surgical wires, plates, or screws to hold the bone in place.   You may have X-rays every couple of weeks to check on your healing.    Follow these instructions at home:   Keep the injured arm above the level of your heart while you are sitting or lying down. This helps to reduce swelling and pain.   Apply ice to the injured area:  ? Put ice in a plastic bag.  ? Place a towel between your skin and the bag.  ? Leave the ice on for 20 minutes, 2-3 times per day.   Move your fingers often to avoid stiffness and to minimize swelling.   If you have a plaster or fiberglass cast:  ? Do not try to scratch the skin under the cast using sharp or pointed objects.  ? Check the skin around the cast every day. You may put lotion on any red or sore areas.  ? Keep your cast dry and clean.   If you  have a plaster splint:  ? Wear the splint as directed.  ? Loosen the elastic around the splint if your fingers become numb and tingle, or if they turn cold and blue.   Do not put pressure on any part of your cast until it is fully hardened. Rest your cast only on a pillow for the first 24 hours.   Protect your cast or splint while bathing or showering, as directed by your health care provider. Do not put your cast or splint into water.   Take medicines only as directed by your health care provider.   Return to activities, such as sports, as directed by your health care provider. Ask your health care provider what activities are safe for you.   Keep all follow-up visits as directed by your health care provider. This is important.  Contact a health care provider if:   Your pain medicine is not helping.   Your cast gets damaged or it breaks.   Your cast becomes loose.   Your cast gets wet.   You have more severe pain or swelling than you did before the cast.   You have severe pain when stretching your fingers.   You continue to have pain or stiffness in your elbow or your wrist after your cast is taken off.  Get help right away if:   You cannot move your fingers.   You lose feeling in your fingers or your hand.   Your hand or your fingers turn cold and pale or blue.   You notice a bad smell coming from your cast.   You have drainage from underneath your cast.   You have new stains from blood or drainage seeping through your cast.  This information is not intended to replace advice given to you by your health care provider. Make sure you discuss any questions you have with your health care provider.  Document Released: 03/09/2006 Document Revised: 03/01/2016 Document Reviewed: 03/21/2014  Elsevier Interactive Patient Education  2018 [employer]

## 2017-04-10 NOTE — ED Triage Notes (Signed)
Movement limited to left hand and wrist. Pt reports decreased sensation to fingers.

## 2017-04-10 NOTE — ED Notes (Signed)
Bed: RESB Expected date:  Expected time:  Means of arrival:  Comments: 41 yo M/Wrist Deformity

## 2018-05-27 IMAGING — DX DG ABD PORTABLE 1V
2 series · 2 of 2 positions shown · non-contrast
Comparison: Abdominal CT dated 03/06/2017

CLINICAL DATA: 41-year-old male with enteric tube placement.

EXAM:
PORTABLE ABDOMEN - 1 VIEW

[abdomen kub]
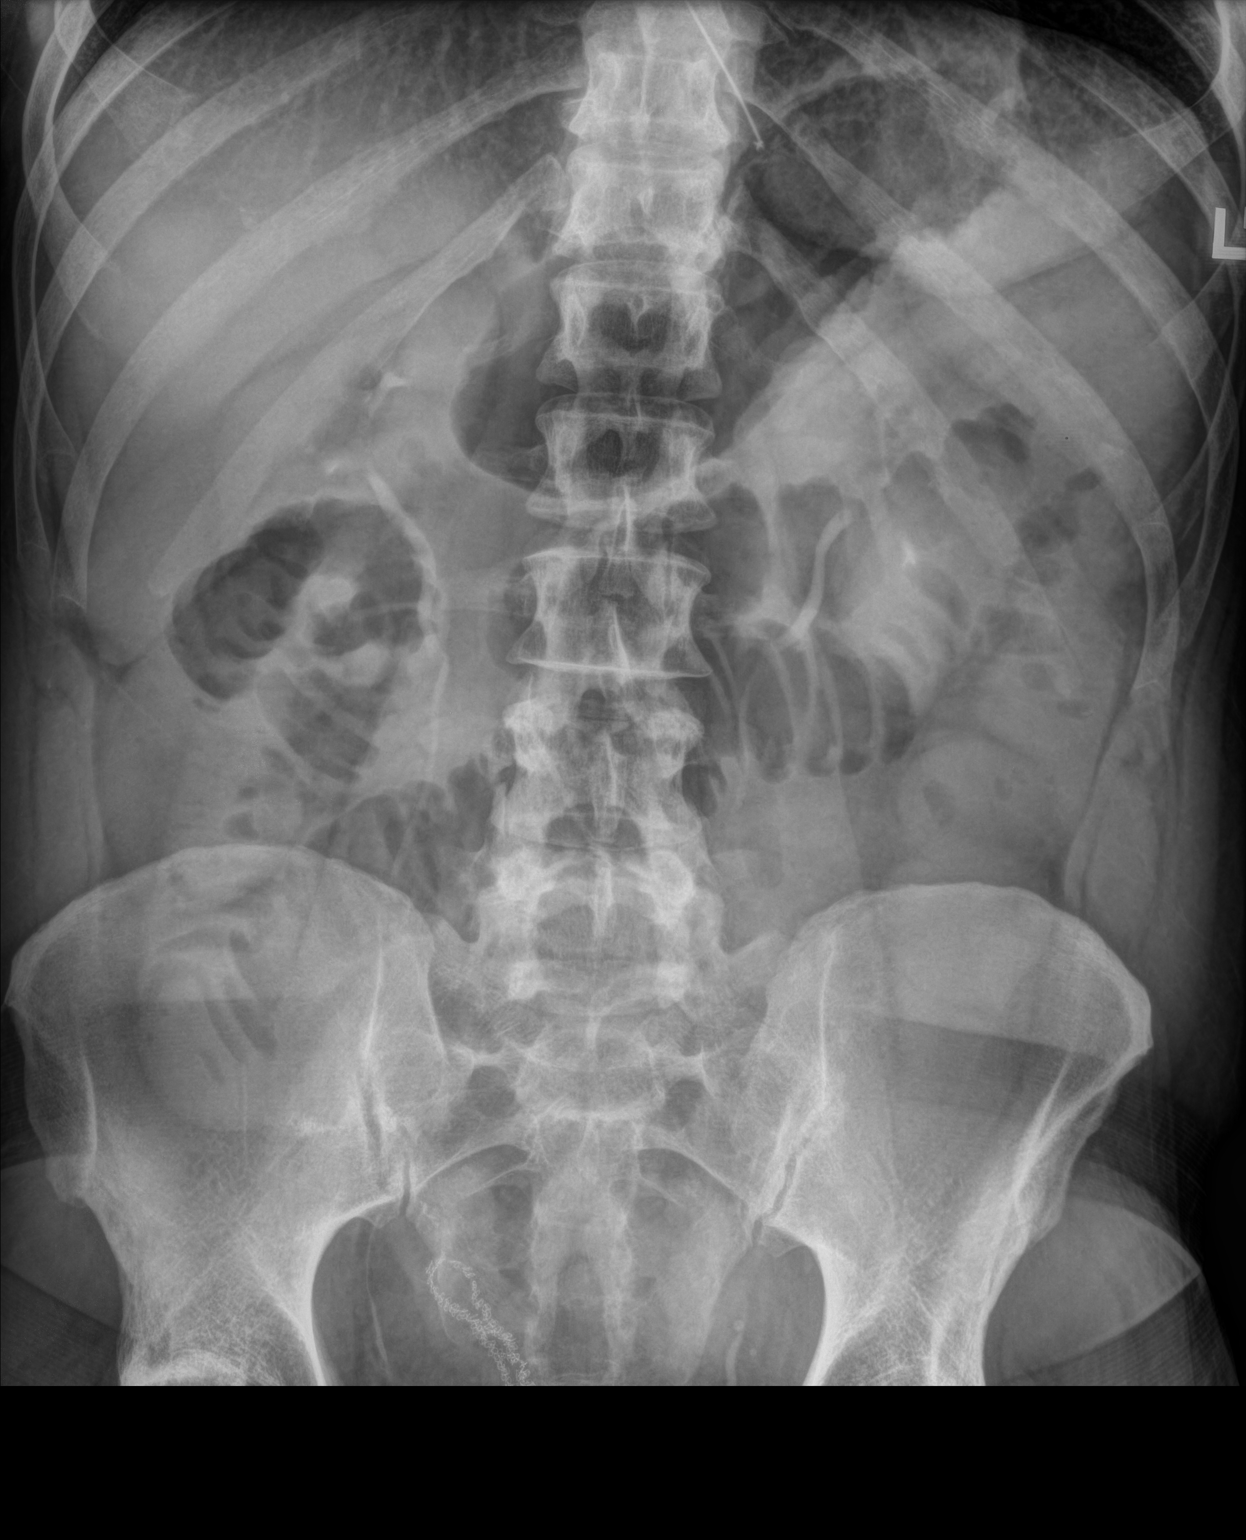

[abdomen decu]
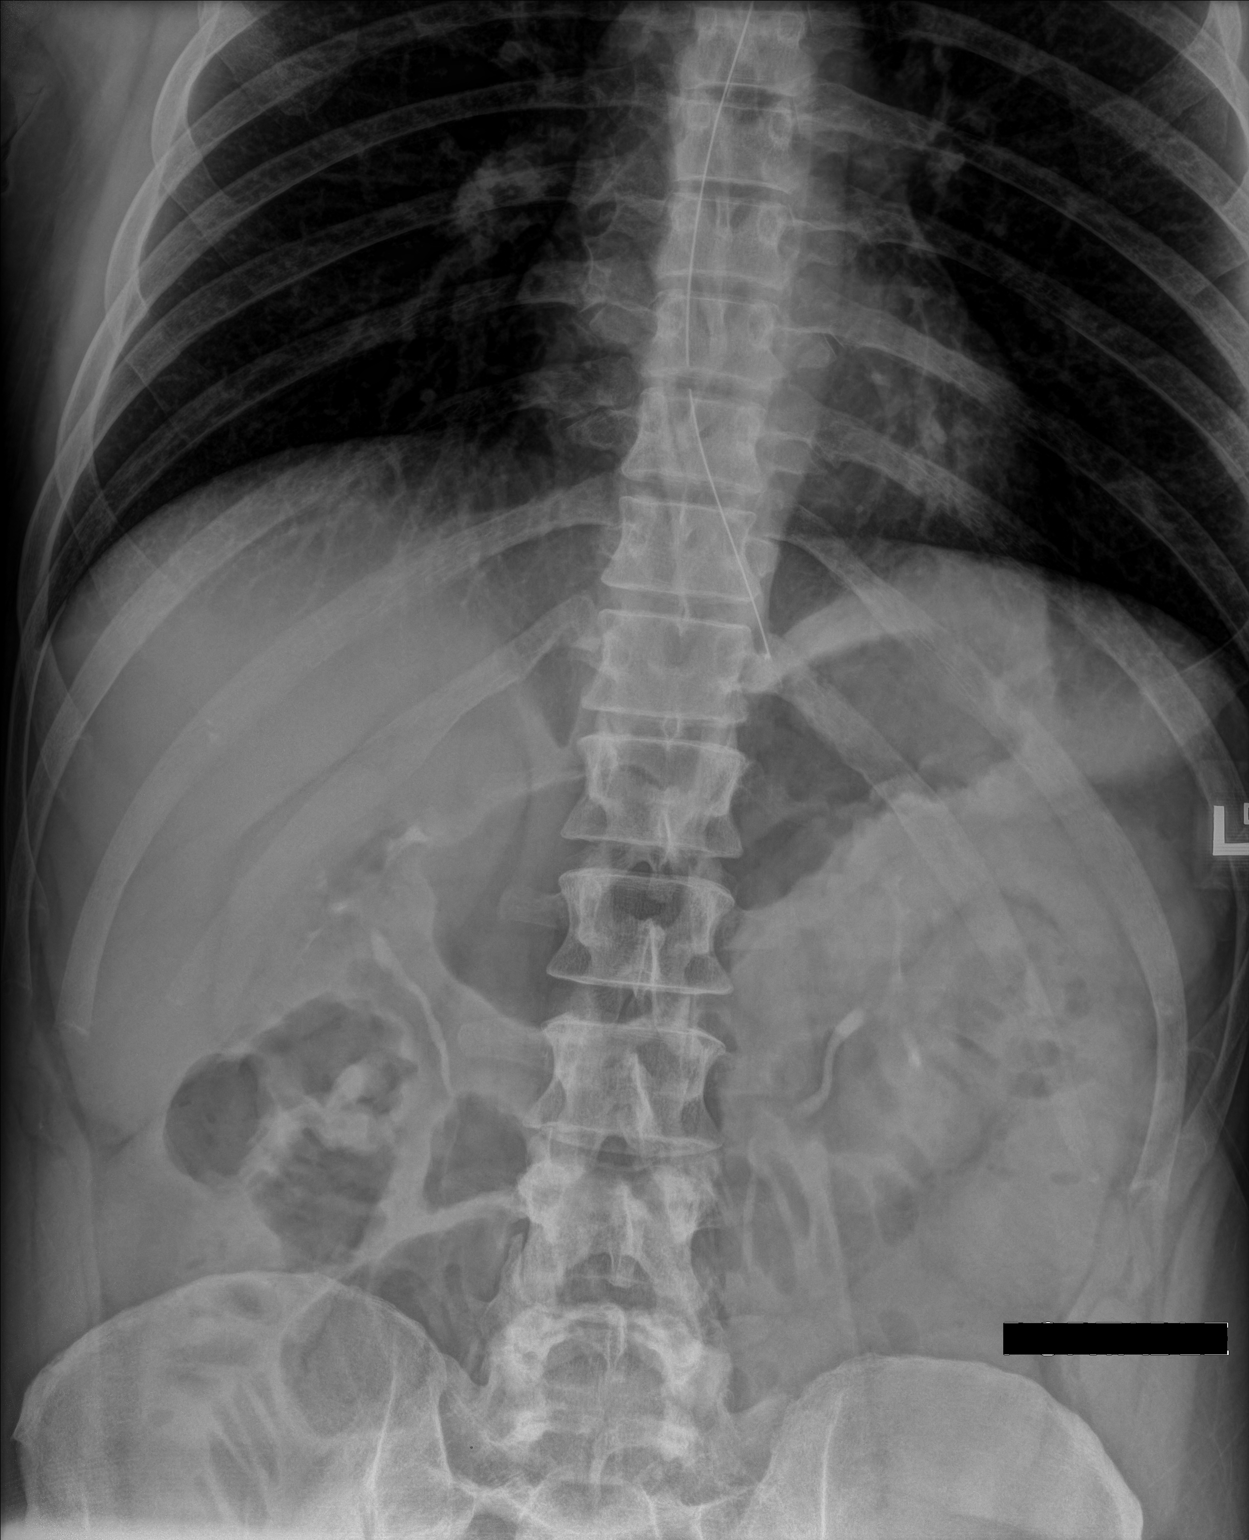

[2 of 2 positions shown; findings below may reference images not displayed]

FINDINGS: There has been interval placement of an enteric tube. The side port
of the tube is located in the distal esophagus and the tip at the
level of the gastroesophageal junction. Recommend further advancing
the tube into the stomach by an additional 10 cm. Dilated loops of
small bowel noted in the visualized abdomen. There is excreted
contrast in the renal collecting system and within the urinary
bladder. Surgical suture material within the pelvis. The osseous
structures appear unremarkable.
IMPRESSION: Enteric tube with tip at the level of the gastroesophageal junction.
Recommend further advancing the tube into the stomach.

## 2018-07-01 IMAGING — DX DG WRIST 2V*L*
4 series · 4 of 4 positions shown · non-contrast
Comparison: None.

CLINICAL DATA: Distal radius fracture, postreduction.

EXAM:
LEFT WRIST - 2 VIEW

[wrist ap (1 of 2)]
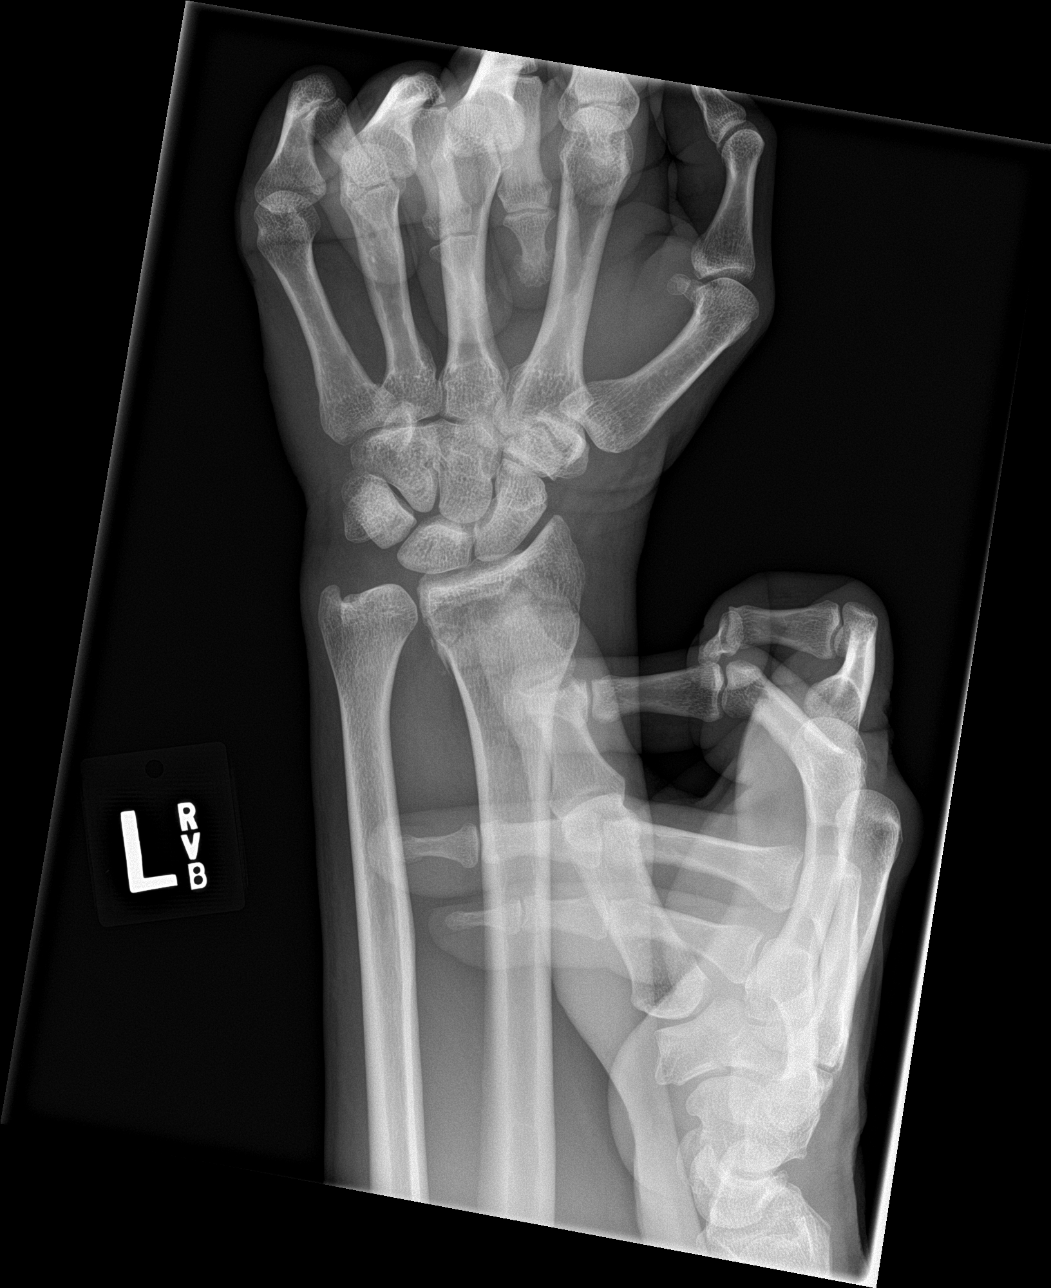

[wrist ap (2 of 2)]
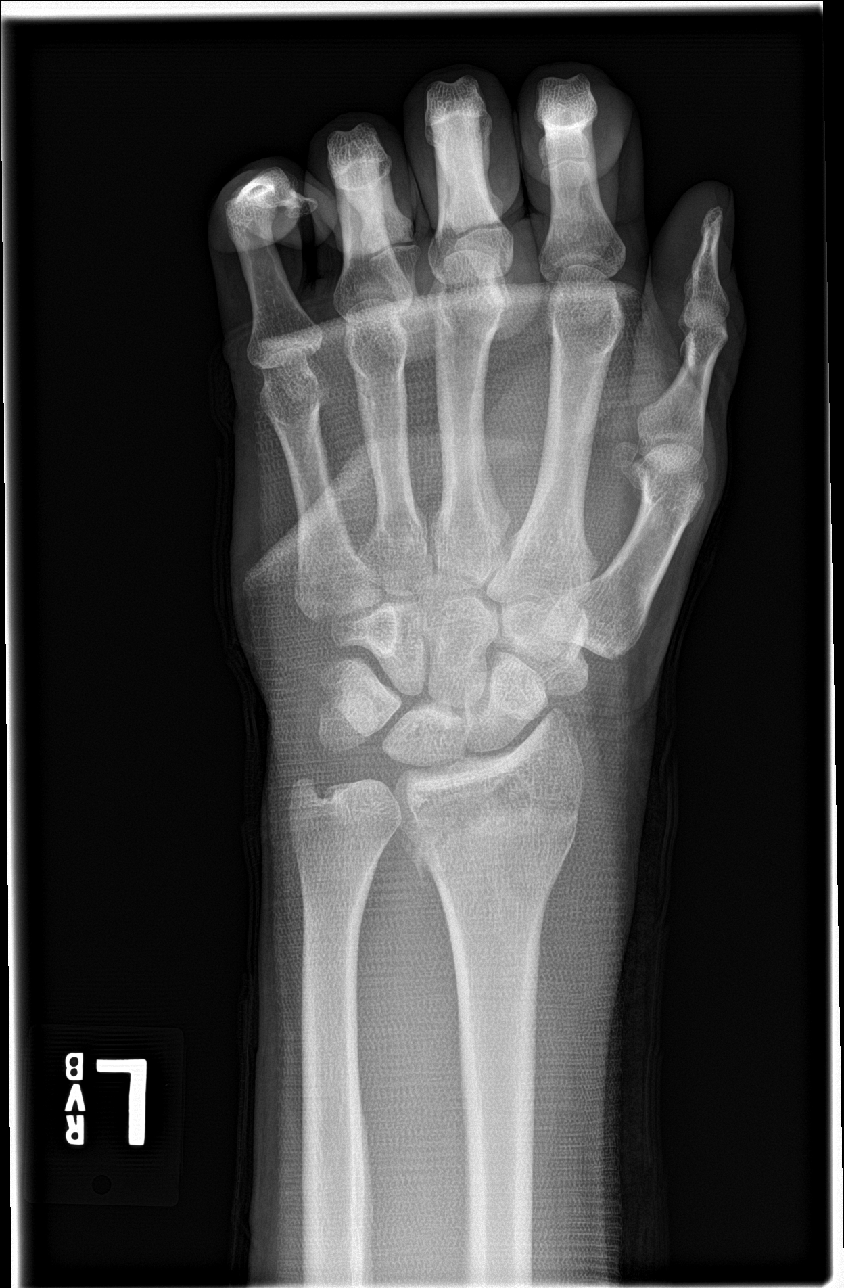

[wrist lat (1 of 2)]
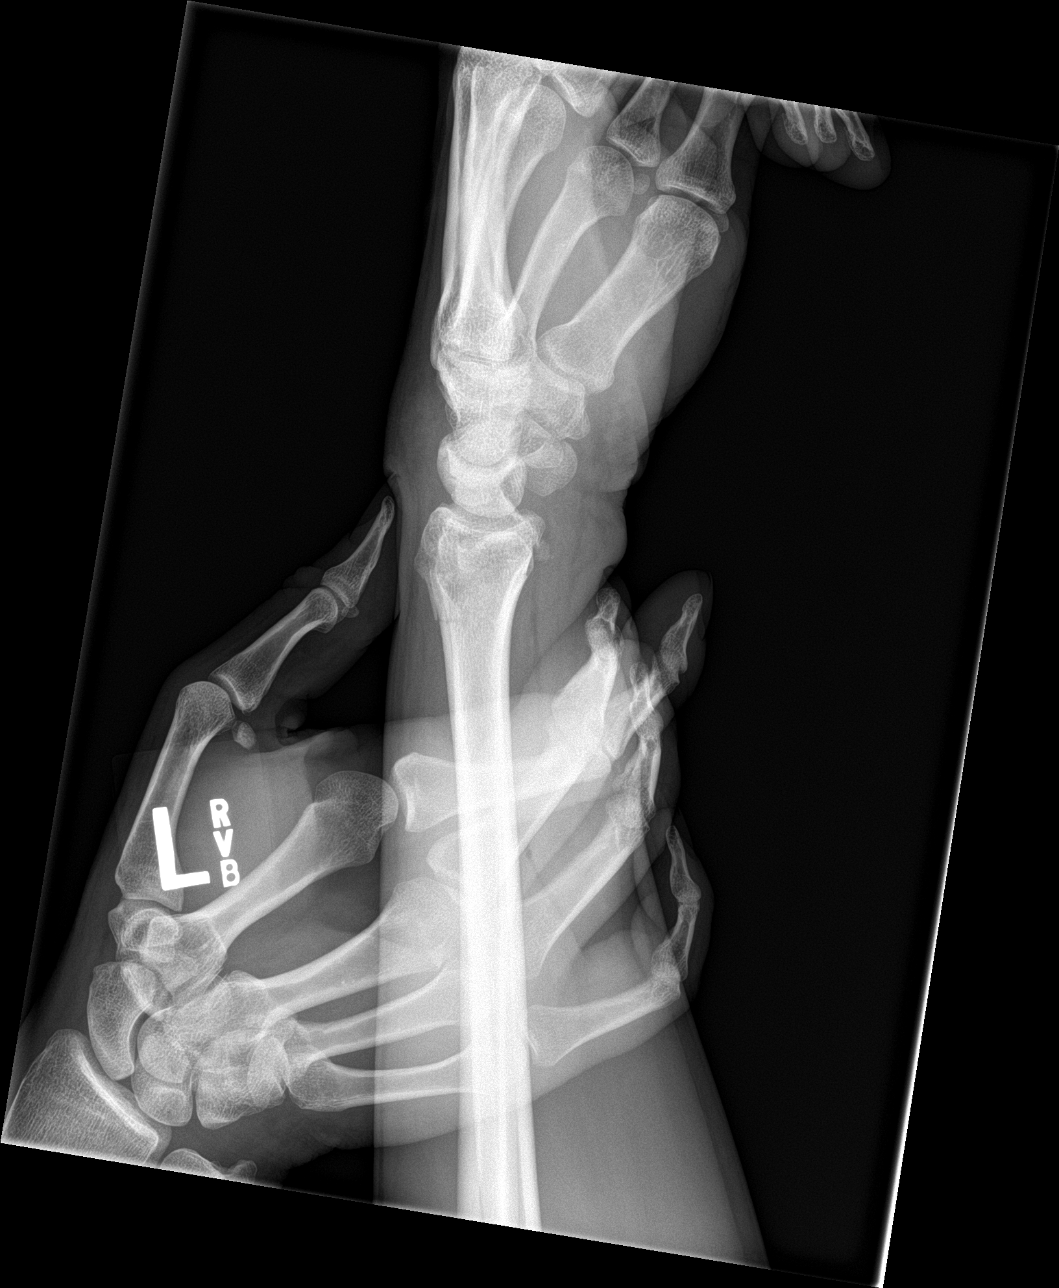

[wrist lat (2 of 2)]
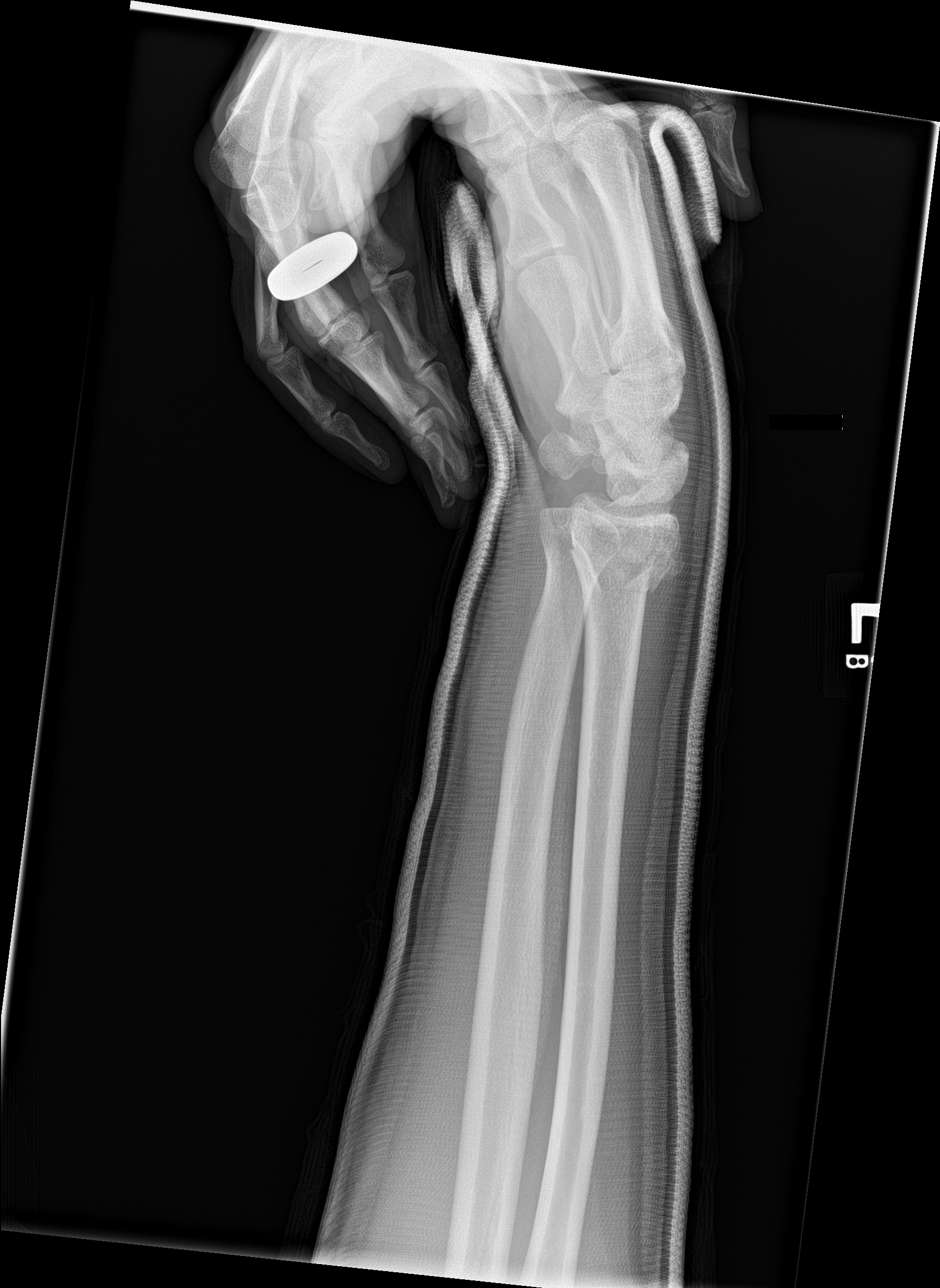

[4 of 4 positions shown; findings below may reference images not displayed]

FINDINGS: Two AP and two lateral views of the left wrist. Images 1 and 2
demonstrate improved alignment of distal radius fracture being
fixated by a manual reduction. Images 3 and 4 in demonstrate
subsequent placement of overlying splint material. Improved
alignment of distal radius fracture with only minimal residual
displacement. No new fracture is seen.
IMPRESSION: Improved alignment of comminuted distal radius fracture
postreduction with overlying splint placement.
